# Patient Record
Sex: Female | Born: 1970 | Marital: Single
Health system: Southern US, Community
[De-identification: ages and names within clinical notes are randomized; demographics above are authoritative.]

---

## 2011-04-03 ENCOUNTER — Emergency Department: Payer: Self-pay | Admitting: Emergency Medicine

## 2011-04-16 ENCOUNTER — Emergency Department: Payer: Self-pay | Admitting: Emergency Medicine

## 2011-05-22 ENCOUNTER — Emergency Department: Payer: Self-pay | Admitting: Emergency Medicine

## 2011-05-22 LAB — PREGNANCY, URINE: Pregnancy Test, Urine: NEGATIVE m[IU]/mL

## 2011-05-22 LAB — URINALYSIS, COMPLETE
Leukocyte Esterase: NEGATIVE
Protein: NEGATIVE
RBC,UR: <1 /HPF (ref 0–5)
Squamous Epithelial: 1

## 2011-05-22 LAB — DRUG SCREEN, URINE
Amphetamines, Ur Screen: NEGATIVE (ref ?–1000)
Barbiturates, Ur Screen: POSITIVE (ref ?–200)
Benzodiazepine, Ur Scrn: NEGATIVE (ref ?–200)
Cocaine Metabolite,Ur ~~LOC~~: POSITIVE (ref ?–300)
MDMA (Ecstasy)Ur Screen: NEGATIVE (ref ?–500)
Opiate, Ur Screen: NEGATIVE (ref ?–300)
Tricyclic, Ur Screen: POSITIVE (ref ?–1000)

## 2011-05-22 LAB — COMPREHENSIVE METABOLIC PANEL
Albumin: 3.8 g/dL (ref 3.4–5.0)
Anion Gap: 11 (ref 7–16)
BUN: 11 mg/dL (ref 7–18)
Bilirubin,Total: 0.5 mg/dL (ref 0.2–1.0)
Calcium, Total: 9 mg/dL (ref 8.5–10.1)
Chloride: 108 mmol/L — ABNORMAL HIGH (ref 98–107)
Co2: 21 mmol/L (ref 21–32)
EGFR (African American): >60
EGFR (Non-African Amer.): >60
Glucose: 102 mg/dL — ABNORMAL HIGH (ref 65–99)
Potassium: 4 mmol/L (ref 3.5–5.1)
SGPT (ALT): 26 U/L
Total Protein: 8.1 g/dL (ref 6.4–8.2)

## 2011-05-22 LAB — CBC
HCT: 40.1 % (ref 35.0–47.0)
HGB: 14 g/dL (ref 12.0–16.0)
MCH: 33.4 pg (ref 26.0–34.0)
MCHC: 35 g/dL (ref 32.0–36.0)
MCV: 96 fL (ref 80–100)
RBC: 4.2 10*6/uL (ref 3.80–5.20)
RDW: 13.4 % (ref 11.5–14.5)
WBC: 8.6 10*3/uL (ref 3.6–11.0)

## 2011-05-22 LAB — ETHANOL
Ethanol %: <0.003 % (ref 0.000–0.080)
Ethanol: <3 mg/dL

## 2011-05-22 LAB — SALICYLATE LEVEL: Salicylates, Serum: <1.7 mg/dL

## 2011-05-22 LAB — TSH: Thyroid Stimulating Horm: 1.6 u[IU]/mL

## 2011-05-23 DIAGNOSIS — I4891 Unspecified atrial fibrillation: Secondary | ICD-10-CM

## 2011-05-28 LAB — VALPROIC ACID LEVEL: Valproic Acid: 59 ug/mL

## 2011-05-28 LAB — COMPREHENSIVE METABOLIC PANEL
Albumin: 3.4 g/dL (ref 3.4–5.0)
Alkaline Phosphatase: 42 U/L — ABNORMAL LOW (ref 50–136)
Anion Gap: 10 (ref 7–16)
Bilirubin,Total: 0.4 mg/dL (ref 0.2–1.0)
Chloride: 104 mmol/L (ref 98–107)
Co2: 26 mmol/L (ref 21–32)
EGFR (African American): >60
Potassium: 4.2 mmol/L (ref 3.5–5.1)
SGPT (ALT): 21 U/L
Sodium: 140 mmol/L (ref 136–145)

## 2011-09-28 ENCOUNTER — Emergency Department: Payer: Self-pay | Admitting: Emergency Medicine

## 2011-12-15 ENCOUNTER — Institutional Professional Consult (permissible substitution): Payer: Self-pay | Admitting: Nurse Practitioner

## 2011-12-22 ENCOUNTER — Emergency Department: Payer: Self-pay | Admitting: Emergency Medicine

## 2011-12-22 LAB — URINALYSIS, COMPLETE
Glucose,UR: NEGATIVE mg/dL (ref 0–75)
Leukocyte Esterase: NEGATIVE
Nitrite: NEGATIVE
Ph: 6 (ref 4.5–8.0)
Protein: NEGATIVE
Specific Gravity: 1.004 (ref 1.003–1.030)
WBC UR: 1 /HPF (ref 0–5)

## 2011-12-22 LAB — DRUG SCREEN, URINE
Amphetamines, Ur Screen: NEGATIVE (ref ?–1000)
Barbiturates, Ur Screen: NEGATIVE (ref ?–200)
Cocaine Metabolite,Ur ~~LOC~~: NEGATIVE (ref ?–300)
Methadone, Ur Screen: NEGATIVE (ref ?–300)
Opiate, Ur Screen: NEGATIVE (ref ?–300)
Phencyclidine (PCP) Ur S: NEGATIVE (ref ?–25)

## 2012-01-16 ENCOUNTER — Emergency Department: Payer: Self-pay | Admitting: Emergency Medicine

## 2012-01-21 ENCOUNTER — Emergency Department: Payer: Self-pay | Admitting: Emergency Medicine

## 2012-02-03 ENCOUNTER — Emergency Department: Payer: Self-pay | Admitting: Emergency Medicine

## 2012-02-14 ENCOUNTER — Emergency Department: Payer: Self-pay | Admitting: Emergency Medicine

## 2012-03-09 ENCOUNTER — Emergency Department: Payer: Self-pay | Admitting: Emergency Medicine

## 2012-03-18 ENCOUNTER — Emergency Department: Payer: Self-pay | Admitting: Emergency Medicine

## 2012-03-20 ENCOUNTER — Emergency Department: Payer: Self-pay | Admitting: Internal Medicine

## 2012-03-24 ENCOUNTER — Emergency Department: Payer: Self-pay | Admitting: Emergency Medicine

## 2012-05-15 ENCOUNTER — Emergency Department: Payer: Self-pay | Admitting: Internal Medicine

## 2012-05-25 ENCOUNTER — Emergency Department: Payer: Self-pay | Admitting: Emergency Medicine

## 2012-05-25 LAB — URINALYSIS, COMPLETE
Ketone: NEGATIVE
Nitrite: NEGATIVE
Protein: NEGATIVE
RBC,UR: 5 /HPF (ref 0–5)
Specific Gravity: 1.005 (ref 1.003–1.030)

## 2012-05-25 LAB — CBC WITH DIFFERENTIAL/PLATELET
Basophil #: 0 10*3/uL (ref 0.0–0.1)
Eosinophil %: 1.2 %
HGB: 14 g/dL (ref 12.0–16.0)
Lymphocyte %: 37 %
MCHC: 34.9 g/dL (ref 32.0–36.0)
MCV: 94 fL (ref 80–100)
Monocyte %: 4.9 %
RBC: 4.25 10*6/uL (ref 3.80–5.20)

## 2012-05-25 LAB — COMPREHENSIVE METABOLIC PANEL
Albumin: 4 g/dL (ref 3.4–5.0)
Alkaline Phosphatase: 60 U/L (ref 50–136)
Anion Gap: 3 — ABNORMAL LOW (ref 7–16)
BUN: 12 mg/dL (ref 7–18)
Bilirubin,Total: 0.3 mg/dL (ref 0.2–1.0)
Calcium, Total: 8.4 mg/dL — ABNORMAL LOW (ref 8.5–10.1)
Chloride: 106 mmol/L (ref 98–107)
Co2: 27 mmol/L (ref 21–32)
Creatinine: 0.67 mg/dL (ref 0.60–1.30)
EGFR (Non-African Amer.): >60
Osmolality: 271 (ref 275–301)
SGPT (ALT): 47 U/L (ref 12–78)
Total Protein: 8 g/dL (ref 6.4–8.2)

## 2012-05-25 LAB — TROPONIN I: Troponin-I: <0.02 ng/mL

## 2012-06-02 ENCOUNTER — Emergency Department: Payer: Self-pay | Admitting: Emergency Medicine

## 2012-06-04 ENCOUNTER — Emergency Department: Payer: Self-pay | Admitting: Internal Medicine

## 2012-06-12 ENCOUNTER — Emergency Department: Payer: Self-pay | Admitting: Emergency Medicine

## 2012-06-13 ENCOUNTER — Emergency Department: Payer: Self-pay | Admitting: Emergency Medicine

## 2012-06-15 ENCOUNTER — Emergency Department: Payer: Self-pay | Admitting: Emergency Medicine

## 2012-06-16 ENCOUNTER — Emergency Department: Payer: Self-pay | Admitting: Emergency Medicine

## 2013-05-13 IMAGING — CR DG CHEST 1V PORT
1 series · 1 of 1 positions shown · non-contrast
Comparison: none

REASON FOR EXAM: syncope
COMMENTS:

PROCEDURE:     DXR - DXR PORTABLE CHEST SINGLE VIEW  - December 22, 2011  [DATE]
RESULT:     Comparison: None.

[ap]
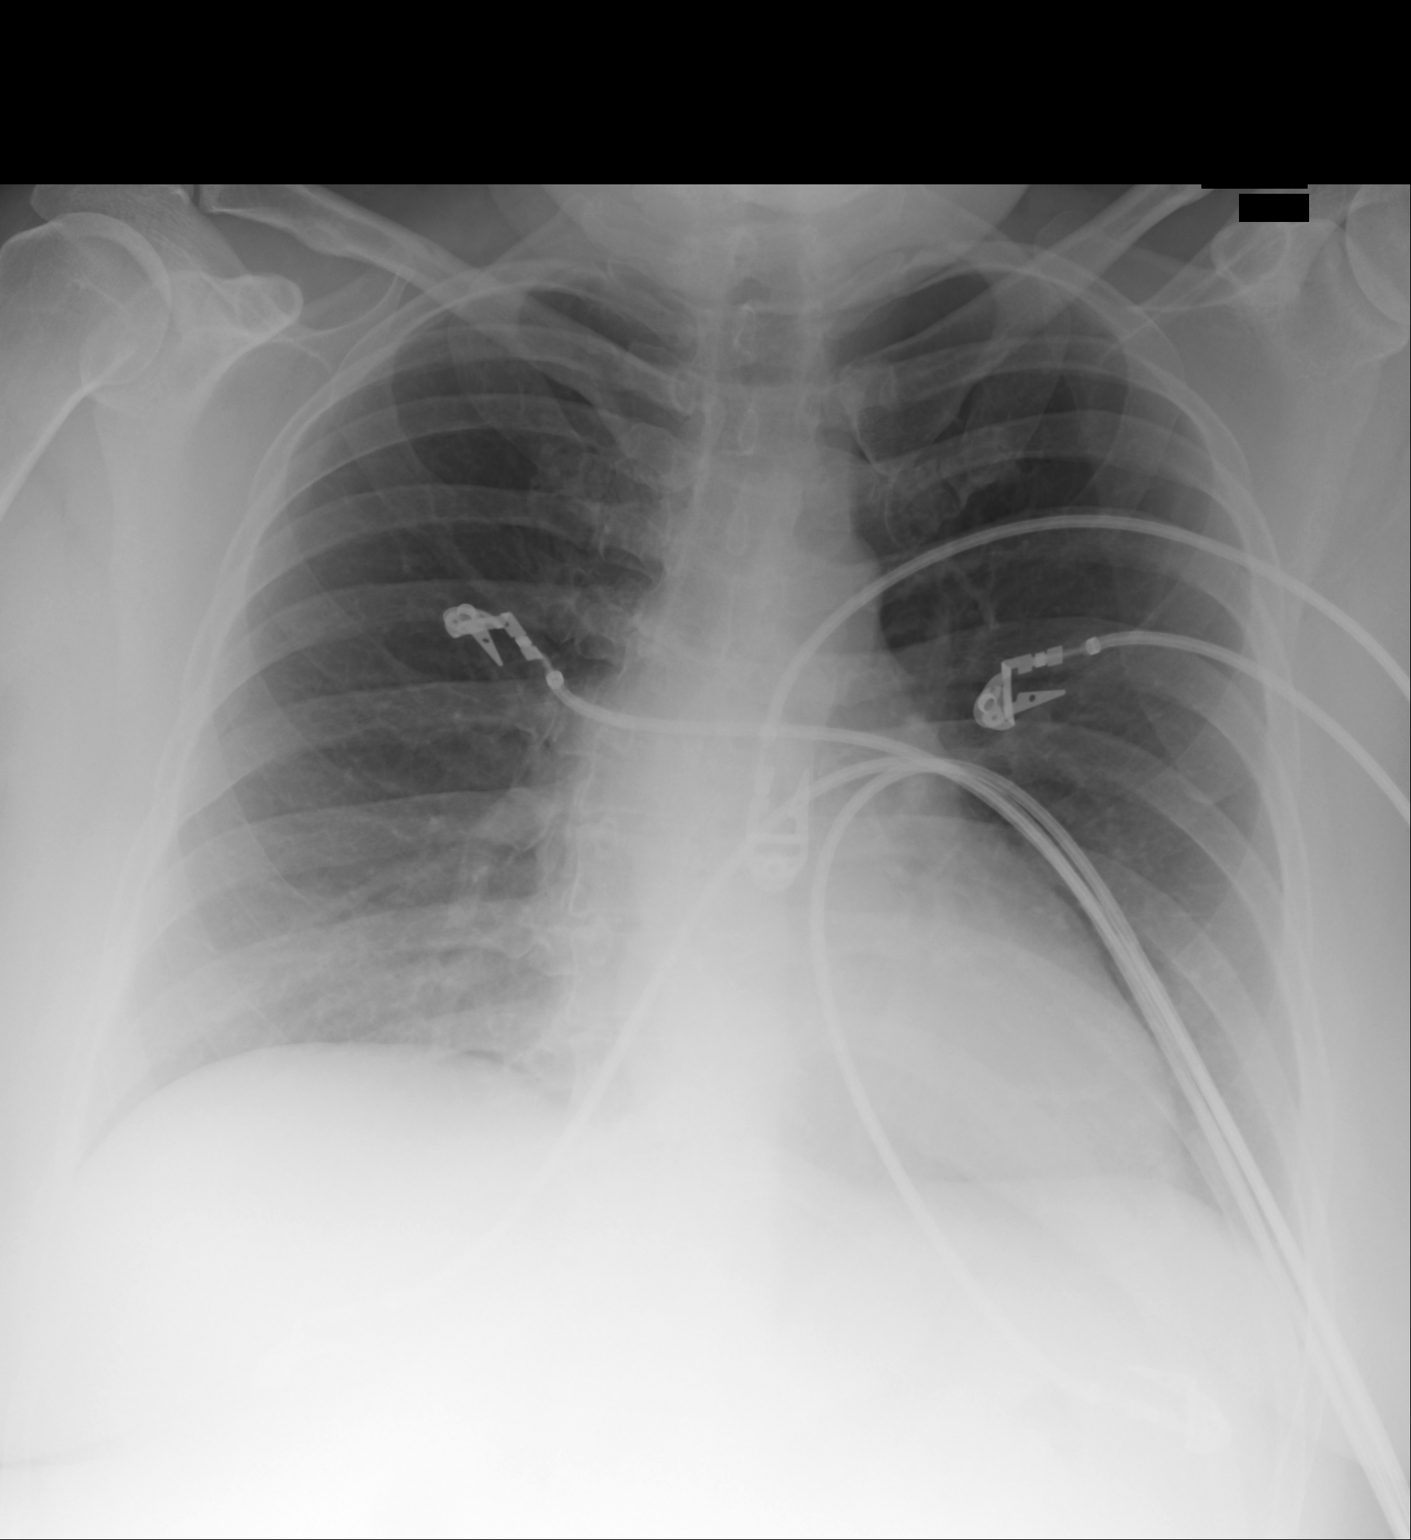

[1 of 1 positions shown; findings below may reference images not displayed]

FINDINGS: Heart size upper limits normal to mildly enlarged. Mild increased density at
the lung bases is felt to be secondary to the overlying soft tissue. No
focal pulmonary opacities. Linear metallic density overlying the mouth may
represent a tongue piercing.
IMPRESSION: No acute cardiopulmonary disease.

[REDACTED]

## 2013-11-05 IMAGING — CR DG ANKLE COMPLETE 3+V*L*
1 series · 5 of 5 positions shown · non-contrast
Comparison: none

REASON FOR EXAM: sprained
COMMENTS:   LMP: One week ago

PROCEDURE:     DXR - DXR ANKLE LEFT COMPLETE  - June 15, 2012  [DATE]
RESULT:     There is no evidence of fracture, dislocation, or malalignment.

[Series 1: ap · 0.17mm/px · 5 of 5 slices shown]
[im 1/5]
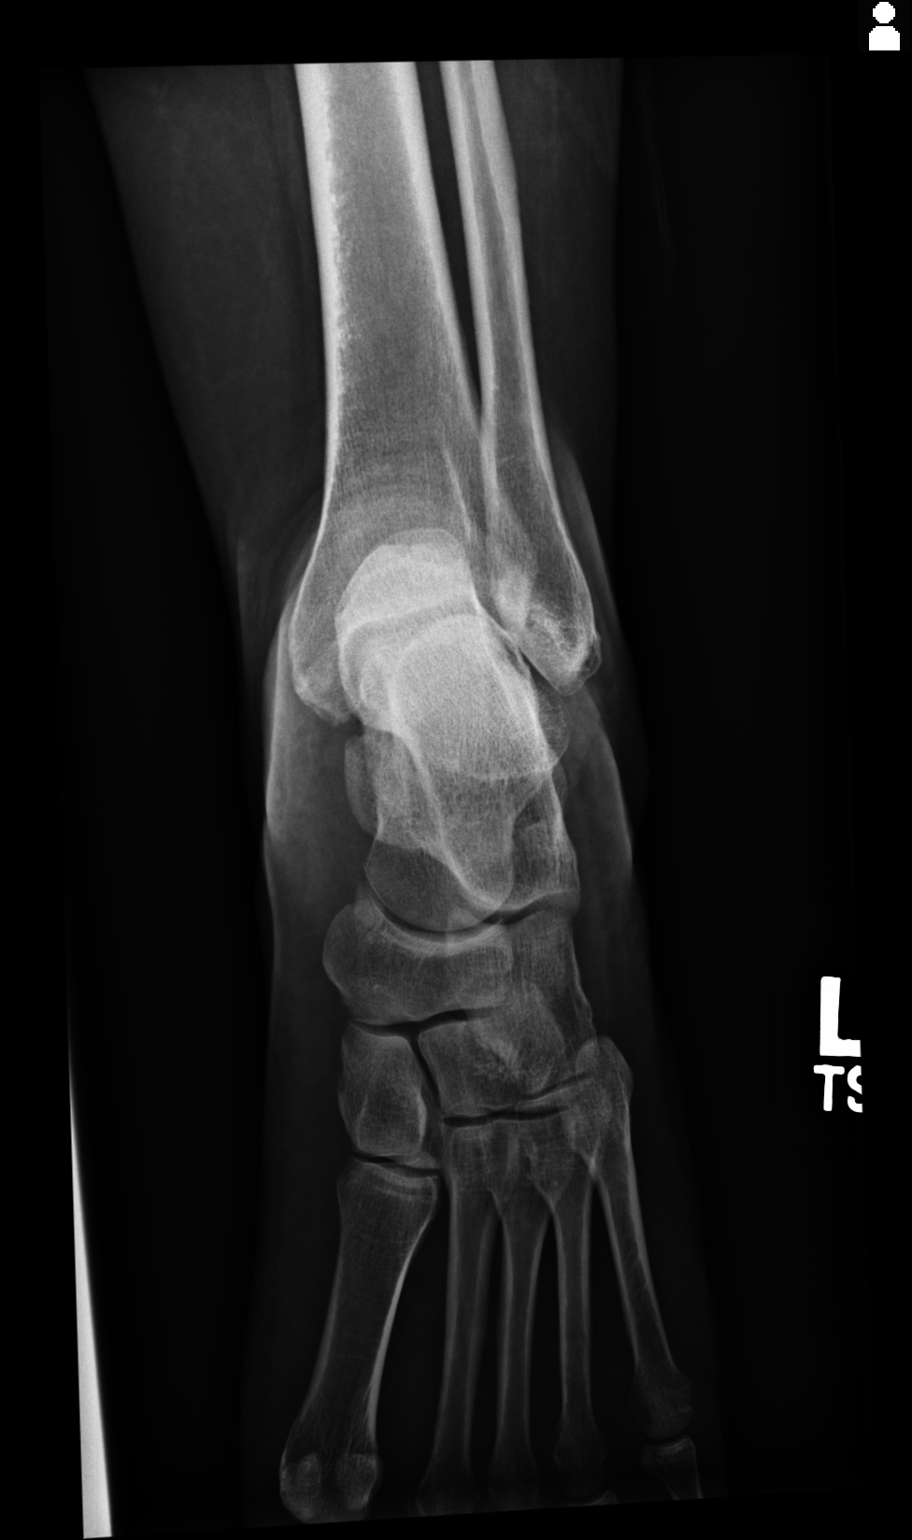
[im 2/5]
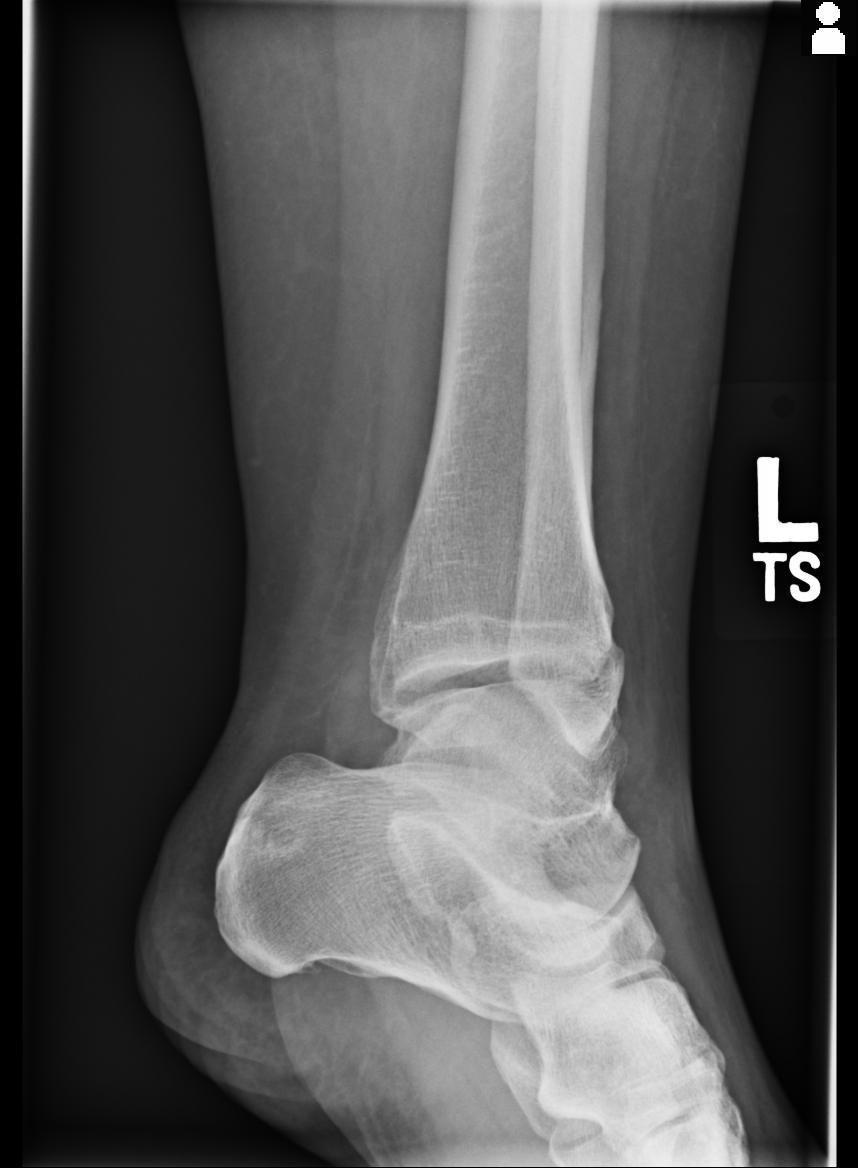
[im 3/5]
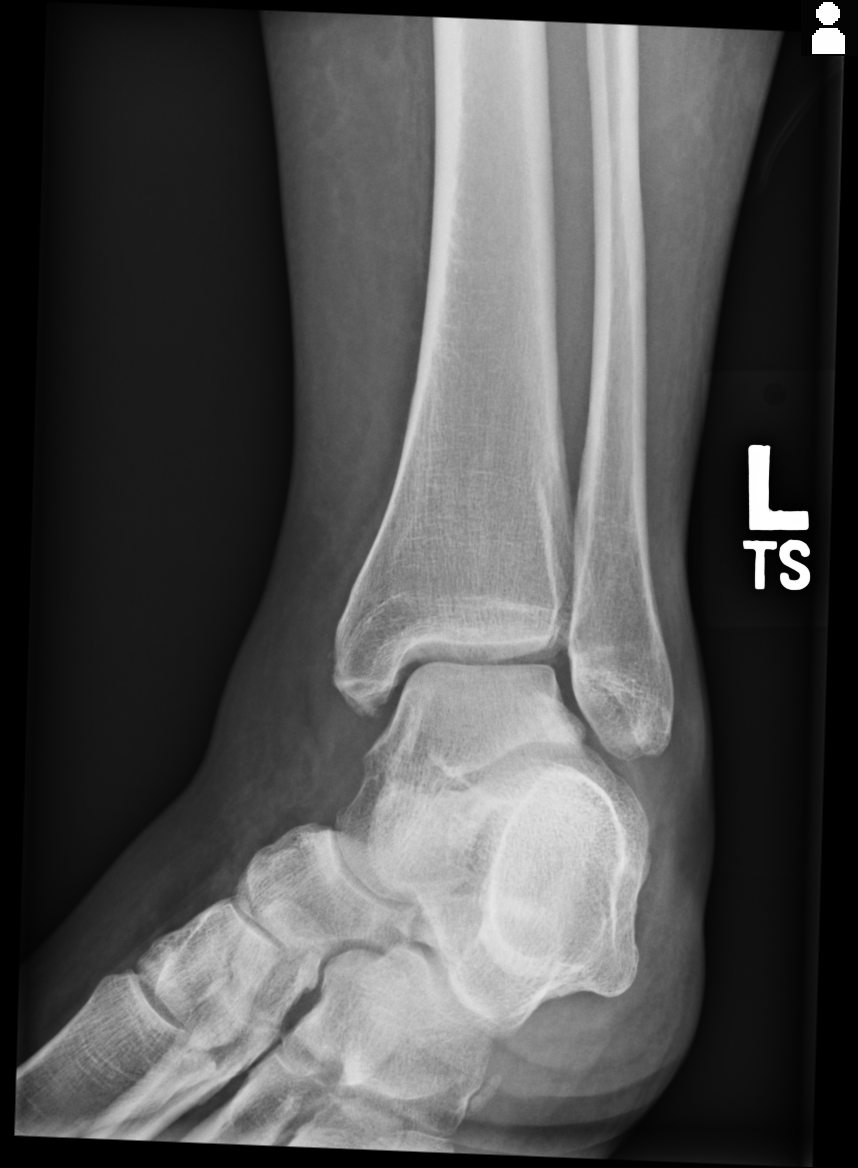
[im 4/5]
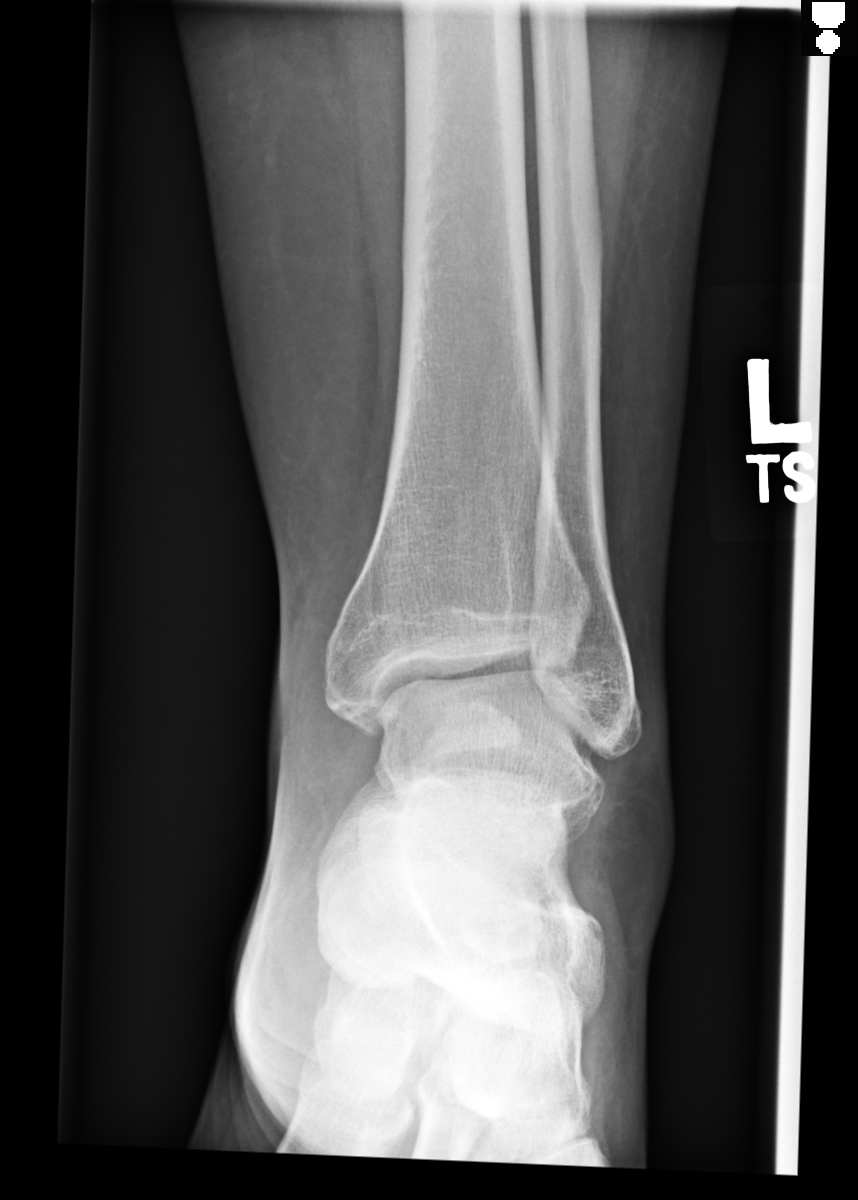
[im 5/5]
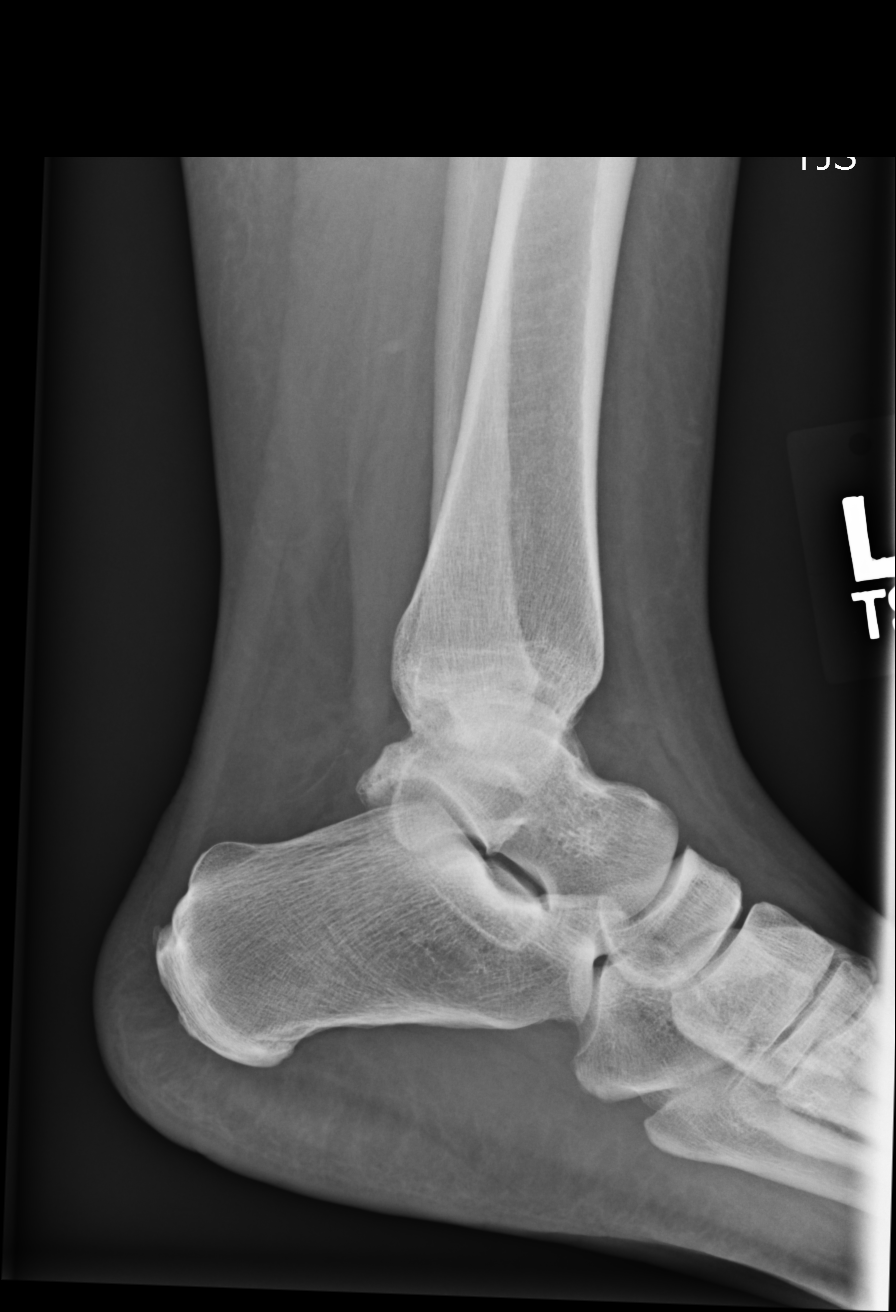

[5 of 5 positions shown; findings below may reference images not displayed]

IMPRESSION: 1. No evidence of acute abnormalities.
2. If there are persistent complaints of pain or persistent clinical
concern, a repeat evaluation in 7-10 days is recommended if clinically
warranted.
3. Comparison made to prior study dated 06/12/2012
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
## 2016-09-28 ENCOUNTER — Ambulatory Visit (HOSPITAL_COMMUNITY): Admission: EM | Admit: 2016-09-28 | Discharge: 2016-09-28 | Discharge status: Absent without leave | Payer: Self-pay
